# Patient Record
Sex: Male | Born: 1990 | Race: Black or African American | Hispanic: No | Marital: Single | Smoking: Former smoker
Health system: Southern US, Community
[De-identification: ages and names within clinical notes are randomized; demographics above are authoritative.]

---

## 2013-06-21 ENCOUNTER — Encounter (HOSPITAL_COMMUNITY): Payer: Self-pay | Admitting: Emergency Medicine

## 2013-06-21 ENCOUNTER — Emergency Department (HOSPITAL_COMMUNITY)
Admission: EM | Admit: 2013-06-21 | Discharge: 2013-06-21 | Disposition: A | Discharge status: Home / Self Care | Payer: Self-pay | Attending: Emergency Medicine | Admitting: Emergency Medicine

## 2013-06-21 DIAGNOSIS — Y929 Unspecified place or not applicable: Secondary | ICD-10-CM | POA: Insufficient documentation

## 2013-06-21 DIAGNOSIS — S0100XA Unspecified open wound of scalp, initial encounter: Secondary | ICD-10-CM | POA: Insufficient documentation

## 2013-06-21 DIAGNOSIS — Y939 Activity, unspecified: Secondary | ICD-10-CM | POA: Insufficient documentation

## 2013-06-21 DIAGNOSIS — S0101XA Laceration without foreign body of scalp, initial encounter: Secondary | ICD-10-CM

## 2013-06-21 DIAGNOSIS — R296 Repeated falls: Secondary | ICD-10-CM | POA: Insufficient documentation

## 2013-06-21 DIAGNOSIS — Z87891 Personal history of nicotine dependence: Secondary | ICD-10-CM | POA: Insufficient documentation
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
## 2013-06-21 NOTE — ED Notes (Signed)
Pt states he fell on concrete. Pt has 2 inch deep LAC to right side of head.

## 2013-06-21 NOTE — ED Provider Notes (Signed)
Medical screening examination/treatment/procedure(s) were performed by non-physician practitioner and as supervising physician I was immediately available for consultation/collaboration.  EKG Interpretation   None         Brandt Loosen, MD 06/21/13 [phone]

## 2013-06-29 ENCOUNTER — Encounter (HOSPITAL_COMMUNITY): Payer: Self-pay | Admitting: Emergency Medicine

## 2013-06-29 ENCOUNTER — Emergency Department (INDEPENDENT_AMBULATORY_CARE_PROVIDER_SITE_OTHER)
Admission: EM | Admit: 2013-06-29 | Discharge: 2013-06-29 | Disposition: A | Discharge status: Home / Self Care | Payer: Self-pay | Source: Home / Self Care | Attending: Emergency Medicine | Admitting: Emergency Medicine

## 2013-06-29 DIAGNOSIS — Z4802 Encounter for removal of sutures: Secondary | ICD-10-CM

## 2013-06-29 DIAGNOSIS — Z23 Encounter for immunization: Secondary | ICD-10-CM

## 2013-06-29 DIAGNOSIS — S0101XD Laceration without foreign body of scalp, subsequent encounter: Secondary | ICD-10-CM

## 2013-06-29 MED ORDER — TETANUS-DIPHTH-ACELL PERTUSSIS 5-2.5-18.5 LF-MCG/0.5 IM SUSP
0.5000 mL | Freq: Once | INTRAMUSCULAR | Status: AC
  Administered 2013-06-29: 0.5 mL via INTRAMUSCULAR

## 2013-06-29 MED ORDER — TETANUS-DIPHTH-ACELL PERTUSSIS 5-2.5-18.5 LF-MCG/0.5 IM SUSP
INTRAMUSCULAR | Status: AC
  Filled 2013-06-29: qty 0.5

## 2013-06-29 NOTE — ED Provider Notes (Signed)
Chief Complaint:   Chief Complaint  Patient presents with  . Suture / Staple Removal    History of Present Illness:   Kirk Harris is a 22 year old male who sustained a right parietal scalp laceration 8 days ago. This was stapled in the emergency room he returns today for staple removal. He states it hurts just a little, and there's been no drainage.    Review of Systems:  Other than noted above, the patient denies any of the following symptoms: Systemic:  No fever or chills. Eye:  No eye pain, redness, diplopia or blurred vision ENT:  No bleeding from nose or ears.  No loose or broken teeth. Neck:  No pain or limited ROM. GI:  No nausea or vomiting. Neuro:  No loss of consciousness, seizure activity, numbness, tingling, or weakness.  PMFSH:  Past medical history, family history, social history, meds, and allergies were reviewed.    Physical Exam:   Vital signs:  BP 117/67  Pulse 64  Temp(Src) 98.3 F (36.8 C) (Oral)  Resp 14  SpO2 98% General:  Alert and oriented times 3.  In no distress. Eye:  PERRL, full EOMs.  Lids and conjunctivas normal. HEENT:  There is a stapled laceration in the right parietal area. There is some crusted blood, but no evidence of infection. No swelling or tenderness to palpation.  Procedure: Verbal informed consent was obtained.  The patient was informed of the risks and benefits of the procedure and understands and accepts.  Identity of the patient was verified verbally and by wristband. The area of laceration was prepped with alcohol and the staples were removed. The patient tolerated this well and he was given wound care instructions.  Assessment:  The encounter diagnosis was Scalp laceration, subsequent encounter.  The laceration appears to be well-healed with no evidence of infection.  Plan:   1.  Meds:  The following meds were prescribed:  There are no discharge medications for this patient.   2.  Patient Education/Counseling:  The patient was  given appropriate handouts, self care instructions, and instructed in symptomatic relief. Instructions were given for wound care.  Other than washing with soap and water there is no special wound care for him at this time.  3.  Follow up:  The patient was told to follow up immediately if there is any sign of infection.   Reuben Likes, MD 06/29/13 [phone]

## 2013-06-29 NOTE — ED Notes (Signed)
Pt  Reports       He  Is  Here  For  Staple  Removal        They  Appear  To  Be  Well  Healing

## 2013-10-25 ENCOUNTER — Emergency Department (HOSPITAL_COMMUNITY)
Admission: EM | Admit: 2013-10-25 | Discharge: 2013-10-26 | Disposition: A | Discharge status: Home / Self Care | Payer: No Typology Code available for payment source | Attending: Emergency Medicine | Admitting: Emergency Medicine

## 2013-10-25 ENCOUNTER — Encounter (HOSPITAL_COMMUNITY): Payer: Self-pay | Admitting: Emergency Medicine

## 2013-10-25 DIAGNOSIS — S199XXA Unspecified injury of neck, initial encounter: Principal | ICD-10-CM

## 2013-10-25 DIAGNOSIS — M542 Cervicalgia: Secondary | ICD-10-CM

## 2013-10-25 DIAGNOSIS — Z87891 Personal history of nicotine dependence: Secondary | ICD-10-CM | POA: Insufficient documentation

## 2013-10-25 DIAGNOSIS — M549 Dorsalgia, unspecified: Secondary | ICD-10-CM

## 2013-10-25 DIAGNOSIS — IMO0002 Reserved for concepts with insufficient information to code with codable children: Secondary | ICD-10-CM | POA: Insufficient documentation

## 2013-10-25 DIAGNOSIS — Z791 Long term (current) use of non-steroidal anti-inflammatories (NSAID): Secondary | ICD-10-CM | POA: Insufficient documentation

## 2013-10-25 DIAGNOSIS — Y92410 Unspecified street and highway as the place of occurrence of the external cause: Secondary | ICD-10-CM | POA: Insufficient documentation

## 2013-10-25 DIAGNOSIS — Y9389 Activity, other specified: Secondary | ICD-10-CM | POA: Insufficient documentation

## 2013-10-25 DIAGNOSIS — S0993XA Unspecified injury of face, initial encounter: Secondary | ICD-10-CM | POA: Insufficient documentation
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
## 2013-10-26 MED ORDER — NAPROXEN 500 MG PO TABS: 500.0000 mg | ORAL_TABLET | Freq: Two times a day (BID) | ORAL | Status: DC

## 2013-10-26 MED ORDER — METHOCARBAMOL 500 MG PO TABS: 500.0000 mg | ORAL_TABLET | Freq: Two times a day (BID) | ORAL | Status: DC | PRN

## 2013-10-26 MED ORDER — KETOROLAC TROMETHAMINE 60 MG/2ML IM SOLN
60.0000 mg | Freq: Once | INTRAMUSCULAR | Status: AC
  Administered 2013-10-26: 60 mg via INTRAMUSCULAR
  Filled 2013-10-26 (×2): qty 2
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
## 2013-10-26 NOTE — Discharge Instructions (Signed)
Back Pain, Adult Low back pain is very common. About 1 in 5 people have back pain.The cause of low back pain is rarely dangerous. The pain often gets better over time.About half of people with a sudden onset of back pain feel better in just 2 weeks. About 8 in 10 people feel better by 6 weeks.  CAUSES Some common causes of back pain include:  Strain of the muscles or ligaments supporting the spine.  Wear and tear (degeneration) of the spinal discs.  Arthritis.  Direct injury to the back. DIAGNOSIS Most of the time, the direct cause of low back pain is not known.However, back pain can be treated effectively even when the exact cause of the pain is unknown.Answering your caregiver's questions about your overall health and symptoms is one of the most accurate ways to make sure the cause of your pain is not dangerous. If your caregiver needs more information, he or she may order lab work or imaging tests (X-rays or MRIs).However, even if imaging tests show changes in your back, this usually does not require surgery. HOME CARE INSTRUCTIONS For many people, back pain returns.Since low back pain is rarely dangerous, it is often a condition that people can learn to manageon their own.   Remain active. It is stressful on the back to sit or stand in one place. Do not sit, drive, or stand in one place for more than 30 minutes at a time. Take short walks on level surfaces as soon as pain allows.Try to increase the length of time you walk each day.  Do not stay in bed.Resting more than 1 or 2 days can delay your recovery.  Do not avoid exercise or work.Your body is made to move.It is not dangerous to be active, even though your back may hurt.Your back will likely heal faster if you return to being active before your pain is gone.  Pay attention to your body when you bend and lift. Many people have less discomfortwhen lifting if they bend their knees, keep the load close to their bodies,and  avoid twisting. Often, the most comfortable positions are those that put less stress on your recovering back.  Find a comfortable position to sleep. Use a firm mattress and lie on your side with your knees slightly bent. If you lie on your back, put a pillow under your knees.  Only take over-the-counter or prescription medicines as directed by your caregiver. Over-the-counter medicines to reduce pain and inflammation are often the most helpful.Your caregiver may prescribe muscle relaxant drugs.These medicines help dull your pain so you can more quickly return to your normal activities and healthy exercise.  Put ice on the injured area.  Put ice in a plastic bag.  Place a towel between your skin and the bag.  Leave the ice on for 15-20 minutes, 03-04 times a day for the first 2 to 3 days. After that, ice and heat may be alternated to reduce pain and spasms.  Ask your caregiver about trying back exercises and gentle massage. This may be of some benefit.  Avoid feeling anxious or stressed.Stress increases muscle tension and can worsen back pain.It is important to recognize when you are anxious or stressed and learn ways to manage it.Exercise is a great option. SEEK MEDICAL CARE IF:  You have pain that is not relieved with rest or medicine.  You have pain that does not improve in 1 week.  You have new symptoms.  You are generally not feeling well. SEEK   IMMEDIATE MEDICAL CARE IF:   You have pain that radiates from your back into your legs.  You develop new bowel or bladder control problems.  You have unusual weakness or numbness in your arms or legs.  You develop nausea or vomiting.  You develop abdominal pain.  You feel faint. Document Released: 08/06/2005 Document Revised: 02/05/2012 Document Reviewed: 12/25/2010 Exit[employer] Patient Information 2014 [employer].  

## 2013-10-26 NOTE — ED Provider Notes (Signed)
Medical screening examination/treatment/procedure(s) were performed by non-physician practitioner and as supervising physician I was immediately available for consultation/collaboration.   EKG Interpretation None       Sunnie NielsenBrian Nazli Penn, MD 10/26/13 [phone]

## 2014-06-28 ENCOUNTER — Encounter (HOSPITAL_COMMUNITY): Payer: Self-pay | Admitting: *Deleted

## 2014-06-28 ENCOUNTER — Emergency Department (HOSPITAL_COMMUNITY)
Admission: EM | Admit: 2014-06-28 | Discharge: 2014-06-28 | Disposition: A | Discharge status: Home / Self Care | Payer: Worker's Compensation | Attending: Emergency Medicine | Admitting: Emergency Medicine

## 2014-06-28 DIAGNOSIS — Z791 Long term (current) use of non-steroidal anti-inflammatories (NSAID): Secondary | ICD-10-CM | POA: Diagnosis not present

## 2014-06-28 DIAGNOSIS — Y9289 Other specified places as the place of occurrence of the external cause: Secondary | ICD-10-CM | POA: Insufficient documentation

## 2014-06-28 DIAGNOSIS — Z87891 Personal history of nicotine dependence: Secondary | ICD-10-CM | POA: Insufficient documentation

## 2014-06-28 DIAGNOSIS — S61412A Laceration without foreign body of left hand, initial encounter: Secondary | ICD-10-CM | POA: Diagnosis present

## 2014-06-28 DIAGNOSIS — S61022A Laceration with foreign body of left thumb without damage to nail, initial encounter: Secondary | ICD-10-CM | POA: Diagnosis not present

## 2014-06-28 DIAGNOSIS — Y990 Civilian activity done for income or pay: Secondary | ICD-10-CM | POA: Diagnosis not present

## 2014-06-28 DIAGNOSIS — S61012A Laceration without foreign body of left thumb without damage to nail, initial encounter: Secondary | ICD-10-CM

## 2014-06-28 DIAGNOSIS — Y9389 Activity, other specified: Secondary | ICD-10-CM | POA: Insufficient documentation

## 2014-06-28 DIAGNOSIS — W275XXA Contact with paper-cutter, initial encounter: Secondary | ICD-10-CM | POA: Diagnosis not present

## 2014-06-28 NOTE — ED Provider Notes (Signed)
CSN: [id number]     Arrival date & time 06/28/14  1832 History  This chart was scribed for non-physician practitioner, Harle BattiestElizabeth Raynisha Avilla, NP working with Gwyneth SproutWhitney Plunkett, MD by Gwenyth Oberatherine Macek, ED scribe. This patient was seen in room TR06C/TR06C and the patient's care was started at 6:56 PM  Chief Complaint  Patient presents with  . Extremity Laceration   The history is provided by the patient. No language interpreter was used.    HPI Comments: Kirk Harris is a 23 y.o. male who presents to the Emergency Department complaining of a laceration with controlled bleeding to left hand after he cut it with a box cutter at work. Pt denies any pain associated with the wound. He notes he is up to date with tetanus vaccinations. 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   History reviewed. No pertinent past medical history. History reviewed. No pertinent past surgical history. No family history on file. History  Substance Use Topics  . Smoking status: Former Games developermoker  . Smokeless tobacco: Never Used  . Alcohol Use: Yes     Comment: occ    Review of Systems  Musculoskeletal: Negative for joint swelling and arthralgias.  Skin: Positive for wound. Negative for color change.    Allergies  Review of patient's allergies indicates no known allergies.  Home Medications   Prior to Admission medications   Medication Sig Start Date End Date Taking? Authorizing Provider  methocarbamol (ROBAXIN) 500 MG tablet Take 1 tablet (500 mg total) by mouth 2 (two) times daily as needed for muscle spasms. 10/26/13   Rudene AndaJacob Gray Lackey, PA-C  naproxen (NAPROSYN) 500 MG tablet Take 1 tablet (500 mg total) by mouth 2 (two) times daily. 10/26/13   Rudene AndaJacob Gray Lackey, PA-C   BP 114/64 mmHg  Pulse 58  Temp(Src) 98.3 F (36.8 C) (Oral)  Resp 20  Ht 6' (1.829 m)  Wt 198 lb 7 oz (90.011 kg)  BMI 26.91 kg/m2  SpO2 97% Physical Exam  Constitutional: He is oriented to person, place, and time. He appears  well-developed and well-nourished. No distress.  HENT:  Head: Normocephalic and atraumatic.  Mouth/Throat: Oropharynx is clear and moist. No oropharyngeal exudate.  Eyes: Pupils are equal, round, and reactive to light.  Neck: Neck supple.  Cardiovascular: Normal rate.   Pulmonary/Chest: Effort normal.  Musculoskeletal: He exhibits no edema.  2 cm laceration; edges well approximated going parallel with thumb, medial dorsal aspect of left thumb just distal to the MCP. 5/5 strength with extension, flexion, abduction and adduction.    Neurological: He is alert and oriented to person, place, and time. No cranial nerve deficit.  Skin: Skin is warm and dry. No rash noted.  Psychiatric: He has a normal mood and affect. His behavior is normal.  Nursing note and vitals reviewed.   ED Course  Procedures (including critical care time)  LACERATION REPAIR Performed by: Harle Battiestysinger, Merve Hotard Authorized by: Harle Battiestysinger, Jari Dipasquale Consent: Verbal consent obtained. Risks and benefits: risks, benefits and alternatives were discussed Consent given by: patient Patient identity confirmed: provided demographic data Prepped and Draped in normal sterile fashion Wound explored  Laceration Location: left thumb  Laceration Length: 2cm  No Foreign Bodies seen or palpated  Anesthesia: none  Local anesthetic: none  Anesthetic total: N/A  Irrigation method: syringe Amount of cleaning: standard  Skin closure: Dermabond  Number of sutures: N/A  Technique: N/A  Patient tolerance: Patient tolerated the procedure well with no immediate complications.  DIAGNOSTIC STUDIES: Oxygen Saturation is 97%  on RA, normal by my interpretation.    COORDINATION OF CARE: 7:00 PM Discussed treatment plan with pt at bedside and pt agreed to plan.  Labs Review Labs Reviewed - No data to display  Imaging Review No results found.   EKG Interpretation None      MDM   Final diagnoses:  Thumb laceration,  left, initial encounter   23 yo male with laceration amenable to dermabond repair. His Tdap is UTD. Pressure irrigation performed. Laceration occurred < 2 hours prior to repair which was well tolerated. Pt has no co morbidities to effect normal wound healing. Discussed adhesive home care w pt and answered questions. Pt to f-u for wound check in 7 days. Pt is hemodynamically stable w no complaints prior to dc.  Pt aware of plan and in agreement.  Return precautions provided.   I personally performed the services described in this documentation, which was scribed in my presence. The recorded information has been reviewed and is accurate.  Filed Vitals:   06/28/14 1834 06/28/14 1837  BP:  114/64  Pulse:  58  Temp:  98.3 F (36.8 C)  TempSrc:  Oral  Resp:  20  Height:  6' (1.829 m)  Weight: 198 lb 7 oz (90.011 kg)   SpO2:  97%   Meds given in ED:  Medications - No data to display  Discharge Medication List as of 06/28/2014  7:05 PM         Harle BattiestElizabeth Britt Theard, NP 06/28/14 [phone]  Gwyneth SproutWhitney Plunkett, MD 06/29/14 1511

## 2014-06-28 NOTE — Discharge Instructions (Signed)
Please follow directions provided. [facility]. The adhesive material stay in place for several days but do not pick at it or pull it off. Keep your wound clean and dry and wear dressing as needed. Don't hesitate to return for any new, worsening, or concerning symptoms.  SEEK IMMEDIATE MEDICAL CARE IF:  Your pain is not controlled with prescribed medicine.  You have severe swelling around the wound causing pain and numbness or a change in color in your arm, hand, leg, or foot.  Your wound splits open and starts bleeding.  You have worsening numbness, weakness, or loss of function of any joint around or beyond the wound.  You develop painful lumps near the wound or on the skin anywhere on your body.

## 2014-06-28 NOTE — ED Notes (Signed)
Pt was at work using R hand to cut box in L hand and sliced through cardboard, cutting L thumb, lateral aspect.  Bleeding controlled.

## 2014-07-02 ENCOUNTER — Emergency Department (HOSPITAL_COMMUNITY)
Admission: EM | Admit: 2014-07-02 | Discharge: 2014-07-02 | Disposition: A | Discharge status: Home / Self Care | Payer: Worker's Compensation | Attending: Emergency Medicine | Admitting: Emergency Medicine

## 2014-07-02 ENCOUNTER — Encounter (HOSPITAL_COMMUNITY): Payer: Self-pay | Admitting: *Deleted

## 2014-07-02 DIAGNOSIS — Y838 Other surgical procedures as the cause of abnormal reaction of the patient, or of later complication, without mention of misadventure at the time of the procedure: Secondary | ICD-10-CM | POA: Diagnosis not present

## 2014-07-02 DIAGNOSIS — Z87891 Personal history of nicotine dependence: Secondary | ICD-10-CM | POA: Insufficient documentation

## 2014-07-02 DIAGNOSIS — T8133XA Disruption of traumatic injury wound repair, initial encounter: Secondary | ICD-10-CM

## 2014-07-02 DIAGNOSIS — Z791 Long term (current) use of non-steroidal anti-inflammatories (NSAID): Secondary | ICD-10-CM | POA: Diagnosis not present

## 2014-07-02 DIAGNOSIS — T8131XA Disruption of external operation (surgical) wound, not elsewhere classified, initial encounter: Secondary | ICD-10-CM | POA: Insufficient documentation

## 2014-07-02 DIAGNOSIS — Z4801 Encounter for change or removal of surgical wound dressing: Secondary | ICD-10-CM | POA: Diagnosis present

## 2014-07-02 NOTE — Discharge Instructions (Signed)
Read the information below.  You may return to the Emergency Department at any time for worsening condition or any new symptoms that concern you.  If you develop redness, swelling, pus draining from the wound, or fevers greater than 100.4, return to the ER immediately for a recheck.    Wound Dehiscence Wound dehiscence is when a surgical cut (incision) breaks open and does not heal properly after surgery. It usually happens 7-10 days after surgery. This can be a serious condition. It is important to identify and treat this condition early.  CAUSES  Some common causes of wound dehiscence include:  Stretching of the wound area. This may be caused by lifting, vomiting, violent coughing, or straining during bowel movements.  Wound infection.  Early stitch (suture) removal. RISK FACTORS Various things can increase your risk of developing wound dehiscence, including:  Obesity.  Lung disease.  Smoking.  Poor nutrition.  Contamination during surgery. SIGNS AND SYMPTOMS  Bleeding from the wound.  Pain.  Fever.  Wound starts breaking open. DIAGNOSIS  Your health care provider may diagnose wound dehiscence by monitoring the incision and noting any changes in the wound. These changes can include an increase in drainage or pain. The health care provider may also ask you if you have noticed any stretching or tearing of the wound.  Wound cultures may be taken to determine if there is an infection.  Imaging studies, such as an MRI scan or CT scan, may be done to determine if there is a collection of pus or fluid in the wound area. TREATMENT Treatment may include:  Wound care.  Surgical repair.  Antibiotic medicine to treat or prevent infection.  Medicines to reduce pain and swelling. HOME CARE INSTRUCTIONS   Only take over-the-counter or prescription medicines for pain, discomfort, or fever as directed by your health care provider. Taking pain medicine 30 minutes before changing  a bandage (dressing) can help relieve pain.  Take your antibiotics as directed. Finish them even if you start to feel better.  Gently wash the area with mild soap and water 2 times a day, or as directed. Rinse off the soap. Pat the area dry with a clean towel. Do not rub the wound. This may cause bleeding.  Follow your health care provider's instructions for how often you need to change the dressing and packing inside. Wash your hands well before and after changing your dressing. Apply a dressing to the wound as directed.  Take showers. Do not soak the wound, bathe, swim, or use a hot tub until directed by your health care provider.  Avoid exercises that make you sweat heavily.  Use anti-itch medicine as directed by your health care provider. The wound may itch when it is healing. Do not pick or scratch at the wound.  Do not lift more than 10 pounds (4.5 kg) until the wound is healed, or as directed by your health care provider.  Keep all follow-up appointments as directed. SEEK MEDICAL CARE IF:  You have excessive bleeding from your surgical wound.  Your wound does not seem to be healing properly.  You have a fever. SEEK IMMEDIATE MEDICAL CARE IF:   You have increased swelling or redness around the wound.  You have increasing pain in the wound.  You have an increasing amount of pus coming from the wound.  Your wound breaks open farther. MAKE SURE YOU:   Understand these instructions.  Will watch your condition.  Will get help right away if you are  not doing well or get worse. Document Released: 10/27/2003 Document Revised: 08/11/2013 Document Reviewed: 04/13/2013 [facility] Patient Information 2015 [place], [place]. This information is not intended to replace advice given to you by your health care provider. Make sure you discuss any questions you have with your health care provider.   Laceration Care, Adult A laceration is a cut or lesion that goes through all layers of  the skin and into the tissue just beneath the skin. TREATMENT  Some lacerations may not require closure. Some lacerations may not be able to be closed due to an increased risk of infection. It is important to see your caregiver as soon as possible after an injury to minimize the risk of infection and maximize the opportunity for successful closure. If closure is appropriate, pain medicines may be given, if needed. The wound will be cleaned to help prevent infection. Your caregiver will use stitches (sutures), staples, wound glue (adhesive), or skin adhesive strips to repair the laceration. These tools bring the skin edges together to allow for faster healing and a better cosmetic outcome. However, all wounds will heal with a scar. Once the wound has healed, scarring can be minimized by covering the wound with sunscreen during the day for 1 full year. HOME CARE INSTRUCTIONS  For sutures or staples:  Keep the wound clean and dry.  If you were given a bandage (dressing), you should change it at least once a day. Also, change the dressing if it becomes wet or dirty, or as directed by your caregiver.  Wash the wound with soap and water 2 times a day. Rinse the wound off with water to remove all soap. Pat the wound dry with a clean towel.  After cleaning, apply a thin layer of the antibiotic ointment as recommended by your caregiver. This will help prevent infection and keep the dressing from sticking.  You may shower as usual after the first 24 hours. Do not soak the wound in water until the sutures are removed.  Only take over-the-counter or prescription medicines for pain, discomfort, or fever as directed by your caregiver.  Get your sutures or staples removed as directed by your caregiver. For skin adhesive strips:  Keep the wound clean and dry.  Do not get the skin adhesive strips wet. You may bathe carefully, using caution to keep the wound dry.  If the wound gets wet, pat it dry with a  clean towel.  Skin adhesive strips will fall off on their own. You may trim the strips as the wound heals. Do not remove skin adhesive strips that are still stuck to the wound. They will fall off in time. For wound adhesive:  You may briefly wet your wound in the shower or bath. Do not soak or scrub the wound. Do not swim. Avoid periods of heavy perspiration until the skin adhesive has fallen off on its own. After showering or bathing, gently pat the wound dry with a clean towel.  Do not apply liquid medicine, cream medicine, or ointment medicine to your wound while the skin adhesive is in place. This may loosen the film before your wound is healed.  If a dressing is placed over the wound, be careful not to apply tape directly over the skin adhesive. This may cause the adhesive to be pulled off before the wound is healed.  Avoid prolonged exposure to sunlight or tanning lamps while the skin adhesive is in place. Exposure to ultraviolet light in the first year will darken the  scar.  The skin adhesive will usually remain in place for 5 to 10 days, then naturally fall off the skin. Do not pick at the adhesive film. You may need a tetanus shot if:  You cannot remember when you had your last tetanus shot.  You have never had a tetanus shot. If you get a tetanus shot, your arm may swell, get red, and feel warm to the touch. This is common and not a problem. If you need a tetanus shot and you choose not to have one, there is a rare chance of getting tetanus. Sickness from tetanus can be serious. SEEK MEDICAL CARE IF:   You have redness, swelling, or increasing pain in the wound.  You see a red line that goes away from the wound.  You have yellowish-white fluid (pus) coming from the wound.  You have a fever.  You notice a bad smell coming from the wound or dressing.  Your wound breaks open before or after sutures have been removed.  You notice something coming out of the wound such as wood  or glass.  Your wound is on your hand or foot and you cannot move a finger or toe. SEEK IMMEDIATE MEDICAL CARE IF:   Your pain is not controlled with prescribed medicine.  You have severe swelling around the wound causing pain and numbness or a change in color in your arm, hand, leg, or foot.  Your wound splits open and starts bleeding.  You have worsening numbness, weakness, or loss of function of any joint around or beyond the wound.  You develop painful lumps near the wound or on the skin anywhere on your body. MAKE SURE YOU:   Understand these instructions.  Will watch your condition.  Will get help right away if you are not doing well or get worse. Document Released: 08/06/2005 Document Revised: 10/29/2011 Document Reviewed: 01/30/2011 [facility] Patient Information 2015 [place], [place]. This information is not intended to replace advice given to you by your health care provider. Make sure you discuss any questions you have with your health care provider.

## 2014-07-02 NOTE — ED Provider Notes (Signed)
CSN: [id number]     Arrival date & time 07/02/14  1204 History  This chart was scribed for non-physician practitioner, Trixie DredgeEmily Nealy Hickmon, PA-C,working with Ward GivensIva L Knapp, MD, by Karle PlumberJennifer Tensley, ED Scribe. This patient was seen in room TR11C/TR11C and the patient's care was started at 12:40 PM.  Chief Complaint  Patient presents with  . Wound Check   Patient is a 23 y.o. male presenting with wound check. The history is provided by the patient. No language interpreter was used.  Wound Check    HPI Comments:  Kirk Harris is a 23 y.o. male who presents to the Emergency Department complaining of a laceration to his left thumb that occurred four days ago. He states he was seen here after accidentally cutting himself with a box cutter at work and DermaBond was applied. He states the DermaBond has now fallen off and the wound has opened up. He reports only mild soreness of the area. Pt reports keeping the area covered in the same dressing that was applied when he was here. He denies numbness, tingling or weakness of the thumb, fever, chills, warmth, drainage or red streaking to the area.   History reviewed. No pertinent past medical history. History reviewed. No pertinent past surgical history. History reviewed. No pertinent family history. History  Substance Use Topics  . Smoking status: Former Games developermoker  . Smokeless tobacco: Never Used  . Alcohol Use: Yes     Comment: occ    Review of Systems  Constitutional: Negative for fever and chills.  Skin: Positive for wound. Negative for color change.  Neurological: Negative for weakness and numbness.  All other systems reviewed and are negative.   Allergies  Review of patient's allergies indicates no known allergies.  Home Medications   Prior to Admission medications   Medication Sig Start Date End Date Taking? Authorizing Provider  methocarbamol (ROBAXIN) 500 MG tablet Take 1 tablet (500 mg total) by mouth 2 (two) times daily as needed for muscle  spasms. 10/26/13   Rudene AndaJacob Gray Lackey, PA-C  naproxen (NAPROSYN) 500 MG tablet Take 1 tablet (500 mg total) by mouth 2 (two) times daily. 10/26/13   Rudene AndaJacob Gray Lackey, PA-C   Triage Vitals: BP 115/63 mmHg  Pulse 75  Temp(Src) 97.7 F (36.5 C) (Oral)  Resp 16  Ht 6' (1.829 m)  Wt 199 lb (90.266 kg)  BMI 26.98 kg/m2  SpO2 97% Physical Exam  Constitutional: He appears well-developed and well-nourished. No distress.  HENT:  Head: Normocephalic and atraumatic.  Neck: Neck supple.  Pulmonary/Chest: Effort normal.  Neurological: He is alert.  Skin: He is not diaphoretic.  Left dorsal thumb over first MTP with linear laceration with dehiscence. Dried DermaBond loosely adhered to one side of the wound.  No erythema, edema, warmth, discharge, or tenderness   Nursing note and vitals reviewed.   ED Course  Procedures (including critical care time) DIAGNOSTIC STUDIES: Oxygen Saturation is 97% on RA, normal by my interpretation.   COORDINATION OF CARE: 12:44 PM- Will redress wound. Pt verbalizes understanding and agrees to plan.  Medications - No data to display  Labs Review Labs Reviewed - No data to display  Imaging Review No results found.   EKG Interpretation None      MDM   Final diagnoses:  Dehiscence of laceration repair, initial encounter    Afebrile, nontoxic patient with dehiscence of laceration repaired 4 days ago.  Wound was initially dermabonded.   Unable to close given amount of time it may  have been open.  No e/o infection.  Discussed wound care and strict return precautions.  Wound appears very healthy.  D/C home with wound care, PCP follow up.  Discussed result, findings, treatment, and follow up  with patient.  Pt given return precautions.  Pt verbalizes understanding and agrees with plan.       I personally performed the services described in this documentation, which was scribed in my presence. The recorded information has been reviewed and is  accurate.    Trixie Dredgemily Athanasios Heldman, PA-C 07/02/14 1401  Ward GivensIva L Knapp, MD 07/02/14 [phone]

## 2014-07-02 NOTE — ED Notes (Addendum)
Pt reports being seen on Monday and had dermabond placed to left thumb laceration. Reports not looking at it until today and then noticed it had opened back up and dried blood noted around site. No redness or swelling noted. No other complaints.

## 2017-08-18 ENCOUNTER — Ambulatory Visit (HOSPITAL_COMMUNITY)
Admission: EM | Admit: 2017-08-18 | Discharge: 2017-08-18 | Disposition: A | Discharge status: Home / Self Care | Payer: BLUE CROSS/BLUE SHIELD | Attending: Internal Medicine | Admitting: Internal Medicine

## 2017-08-18 ENCOUNTER — Other Ambulatory Visit: Payer: Self-pay

## 2017-08-18 ENCOUNTER — Encounter (HOSPITAL_COMMUNITY): Payer: Self-pay | Admitting: Emergency Medicine

## 2017-08-18 DIAGNOSIS — H1032 Unspecified acute conjunctivitis, left eye: Secondary | ICD-10-CM

## 2017-08-18 MED ORDER — SULFACETAMIDE SODIUM 10 % OP SOLN: 1.0000 [drp] | Freq: Four times a day (QID) | OPHTHALMIC | 0 refills | Status: AC

## 2017-08-18 MED ORDER — POLYETHYL GLYCOL-PROPYL GLYCOL 0.4-0.3 % OP GEL: 1.0000 "application " | Freq: Every evening | OPHTHALMIC | 0 refills | Status: DC | PRN

## 2017-08-18 NOTE — ED Provider Notes (Signed)
[facility]    CSN: [id number] Arrival date & time: 08/18/17  1809     History   Chief Complaint Chief Complaint  Patient presents with  . Eye Problem    HPI Kirk Harris is a 26 y.o. male.   26 year old male comes in for a few hour history of left eye redness and crusting.  States he woke up a few hours ago with eye crusting shut.  He has  left eye redness without pain, irritation, vision changes.  Denies photophobia.  No known injury/trauma.  Denies contact lens use, glasses use.  States he went to the pharmacy for over-the-counter medication, he was told by pharmacist to come in for evaluation.  Had URI symptoms a few weeks ago, but has since resolved.      History reviewed. No pertinent past medical history.  There are no active problems to display for this patient.   History reviewed. No pertinent surgical history.     Home Medications    Prior to Admission medications   Medication Sig Start Date End Date Taking? Authorizing Provider  methocarbamol (ROBAXIN) 500 MG tablet Take 1 tablet (500 mg total) by mouth 2 (two) times daily as needed for muscle spasms. 10/26/13   Cristobal GoldmannLackey, Jacob, PA-C  naproxen (NAPROSYN) 500 MG tablet Take 1 tablet (500 mg total) by mouth 2 (two) times daily. 10/26/13   Cristobal GoldmannLackey, Jacob, PA-C  Polyethyl Glycol-Propyl Glycol (SYSTANE) 0.4-0.3 % GEL ophthalmic gel Place 1 application into both eyes at bedtime as needed. 08/18/17   Cathie HoopsYu, Antanisha Mohs V, PA-C  sulfacetamide (BLEPH-10) 10 % ophthalmic solution Place 1-2 drops into the left eye 4 (four) times daily for 7 days. 08/18/17 08/25/17  Belinda FisherYu, Lovett Coffin V, PA-C    Family History Family History  Problem Relation Age of Onset  . Healthy Mother     Social History Social History   Tobacco Use  . Smoking status: Former Games developermoker  . Smokeless tobacco: Never Used  Substance Use Topics  . Alcohol use: Yes    Comment: occ  . Drug use: No     Allergies   Patient has no known allergies.   Review of  Systems Review of Systems  Reason unable to perform ROS: See HPI as above.     Physical Exam Triage Vital Signs ED Triage Vitals [08/18/17 1951]  Enc Vitals Group     BP 105/72     Pulse Rate 60     Resp 18     Temp 97.9 F (36.6 C)     Temp Source Oral     SpO2 99 %     Weight      Height      Head Circumference      Peak Flow      Pain Score      Pain Loc      Pain Edu?      Excl. in GC?    No data found.  Updated Vital Signs BP 105/72 (BP Location: Right Arm)   Pulse 60   Temp 97.9 F (36.6 C) (Oral)   Resp 18   SpO2 99%   Visual Acuity Right Eye Distance:   20/25 ([place]) Left Eye Distance:   20/20 ([place]) Bilateral Distance:    Right Eye Near:   Left Eye Near:    Bilateral Near:     Physical Exam  Constitutional: He is oriented to person, place, and time. He appears well-developed and well-nourished. No distress.  HENT:  Head: Normocephalic and atraumatic.  Eyes: EOM and lids are normal. Pupils are equal, round, and reactive to light. Lids are everted and swept, no foreign bodies found. No foreign body present in the right eye. No foreign body present in the left eye. Right conjunctiva is not injected. Left conjunctiva is injected.  Left eye conjunctival injection without ciliary injection.  Neck: Normal range of motion. Neck supple.  Neurological: He is alert and oriented to person, place, and time.  Skin: Skin is warm and dry.     UC Treatments / Results  Labs (all labs ordered are listed, but only abnormal results are displayed) Labs Reviewed - No data to display  EKG  EKG Interpretation None       Radiology No results found.  Procedures Procedures (including critical care time)  Medications Ordered in UC Medications - No data to display   Initial Impression / Assessment and Plan / UC Course  I have reviewed the triage vital signs and the nursing notes.  Pertinent labs & imaging results that were available during my care of the  patient were reviewed by me and considered in my medical decision making (see chart for details).    Start sulfacetamide drops as directed. Artificial tears gel as directed. Lid scrubs and warm compresses as directed. Patient to follow up with ophthalmology if symptoms worsens or does not improve. Return precautions given.    Final Clinical Impressions(s) / UC Diagnoses   Final diagnoses:  Acute bacterial conjunctivitis of left eye    ED Discharge Orders        Ordered    sulfacetamide (BLEPH-10) 10 % ophthalmic solution  4 times daily     08/18/17 2021    Polyethyl Glycol-Propyl Glycol (SYSTANE) 0.4-0.3 % GEL ophthalmic gel  At bedtime PRN     08/18/17 2021        Belinda FisherYu, Shemekia Patane V, PA-C 08/18/17 2026

## 2017-08-18 NOTE — ED Triage Notes (Signed)
Woke up a few hours ago and left eye red and had crusty drainage around lashes.  No known injury.Marland Kitchen.does not wear contacts

## 2017-08-18 NOTE — Discharge Instructions (Signed)
Use sulfacetamide eyedrops as directed on left eye. Artificial tear gel at night. Wait 10-15 minutes between drops, always use artificial tear gel last, as it prevents drops from penetrating through. Lid scrubs and warm compresses as directed. Monitor for any worsening of symptoms, changes in vision, sensitivity to light, eye swelling, follow up with ophthalmology for further evaluation.

## 2018-09-11 ENCOUNTER — Ambulatory Visit: Payer: Managed Care, Other (non HMO)

## 2018-09-11 ENCOUNTER — Ambulatory Visit
Admission: EM | Admit: 2018-09-11 | Discharge: 2018-09-11 | Disposition: A | Discharge status: Home / Self Care | Payer: Managed Care, Other (non HMO) | Attending: Family Medicine | Admitting: Family Medicine

## 2018-09-11 ENCOUNTER — Encounter: Payer: Self-pay | Admitting: Emergency Medicine

## 2018-09-11 DIAGNOSIS — J209 Acute bronchitis, unspecified: Secondary | ICD-10-CM | POA: Insufficient documentation

## 2018-09-11 DIAGNOSIS — Z87891 Personal history of nicotine dependence: Secondary | ICD-10-CM | POA: Diagnosis not present

## 2018-09-11 DIAGNOSIS — M940 Chondrocostal junction syndrome [Tietze]: Secondary | ICD-10-CM

## 2018-09-11 MED ORDER — IPRATROPIUM-ALBUTEROL 0.5-2.5 (3) MG/3ML IN SOLN: 3.0000 mL | Freq: Once | RESPIRATORY_TRACT | Status: DC

## 2018-09-11 MED ORDER — PREDNISONE 20 MG PO TABS: 20.0000 mg | ORAL_TABLET | Freq: Two times a day (BID) | ORAL | 0 refills | Status: AC

## 2018-09-11 NOTE — ED Provider Notes (Addendum)
[facility]   [id number] 09/11/18 Arrival Time: 1017   CC: URI symptoms   SUBJECTIVE: History from: patient.  Kirk Harris is a 28 y.o. male who presents with productive cough with green/ yellow sputum x 3 days.  States he was sick a few weeks ago, symptoms improved, but cough has reoccurred.  Denies positive sick exposure or precipitating event.  Has tried OTC nyquil without relief.  Symptoms are made worse with activity.  Denies previous symptoms in the past.  Complains of associated fatigue, rib pain, and chest wall pain with cough and deep inspiration. Denies fever, chills, sinus pain, rhinorrhea, sore throat, SOB, wheezing, chest pain, nausea, changes in bowel or bladder habits.    Denies recent long travel, lower extremity surgery, hx of malignancy, hx of blood clots, SOB, hormone or steroid use.  Does admit to intermittent tobacco use.    Received flu shot this year: no.  ROS: As per HPI.  History reviewed. No pertinent past medical history. History reviewed. No pertinent surgical history. No Known Allergies No current facility-administered medications on file prior to encounter.    No current outpatient medications on file prior to encounter.   Social History   Socioeconomic History  . Marital status: Single    Spouse name: Not on file  . Number of children: Not on file  . Years of education: Not on file  . Highest education level: Not on file  Occupational History  . Not on file  Social Needs  . Financial resource strain: Not on file  . Food insecurity:    Worry: Not on file    Inability: Not on file  . Transportation needs:    Medical: Not on file    Non-medical: Not on file  Tobacco Use  . Smoking status: Former Games developer  . Smokeless tobacco: Never Used  Substance and Sexual Activity  . Alcohol use: Yes    Comment: occ  . Drug use: No  . Sexual activity: Yes  Lifestyle  . Physical activity:    Days per week: Not on file    Minutes per  session: Not on file  . Stress: Not on file  Relationships  . Social connections:    Talks on phone: Not on file    Gets together: Not on file    Attends religious service: Not on file    Active member of club or organization: Not on file    Attends meetings of clubs or organizations: Not on file    Relationship status: Not on file  . Intimate partner violence:    Fear of current or ex partner: Not on file    Emotionally abused: Not on file    Physically abused: Not on file    Forced sexual activity: Not on file  Other Topics Concern  . Not on file  Social History Narrative  . Not on file   Family History  Problem Relation Age of Onset  . Healthy Mother     OBJECTIVE:  Vitals:   09/11/18 1030  BP: 111/76  Pulse: 88  Resp: 18  Temp: 97.6 F (36.4 C)  TempSrc: Oral  SpO2: 98%     General appearance: alert; appears mildly fatigued, but nontoxic; speaking in full sentences and tolerating own secretions HEENT: NCAT; Ears: EACs clear, TMs pearly gray; Eyes: PERRL.  EOM grossly intact. Nose: nares patent without rhinorrhea, Throat: oropharynx clear, tonsils non erythematous or enlarged, uvula midline  Neck: supple without LAD Lungs: cough: mild; no  respiratory distress; subtle wheezes and rhonchi throughout bilateral lung fields Heart: regular rate and rhythm.  Radial pulses 2+ symmetrical bilaterally Chest wall: NT with AP and lateral compression of chest Skin: warm and dry Psychological: alert and cooperative; normal mood and affect  DIAGNOSTIC STUDIES:  Dg Chest 2 View  Result Date: 09/11/2018 CLINICAL DATA:  Productive cough for 3 days EXAM: CHEST - 2 VIEW COMPARISON:  None. FINDINGS: Interstitial coarsening that is generalized. There is no edema, consolidation, effusion, or pneumothorax. Normal heart size and mediastinal contours. IMPRESSION: Bronchitic markings.  Negative for pneumonia. Electronically Signed   By: Marnee Spring M.D.   On: 09/11/2018 11:32      ASSESSMENT & PLAN:  1. Acute bronchitis, unspecified organism   2. Costochondritis     Meds ordered this encounter  Medications  . DISCONTD: ipratropium-albuterol (DUONEB) 0.5-2.5 (3) MG/3ML nebulizer solution 3 mL  . predniSONE (DELTASONE) 20 MG tablet    Sig: Take 1 tablet (20 mg total) by mouth 2 (two) times daily with a meal for 5 days.    Dispense:  10 tablet    Refill:  0    Order Specific Question:   Supervising Provider    Answer:   Eustace Moore [[id number]]   Chest x-rays did not show sign of pneumonia, but bronchitis Declines breathing treatment and inhaler today Get plenty of rest and push fluids Prednisone prescribed.  Take as directed and to completion Use OTC medications like ibuprofen or tylenol as needed fever or pain Follow up with PCP or [facility] if symptoms persist Return or go to ER if you have any new or worsening symptoms fever, chills, nausea, vomiting, chest pain, cough, shortness of breath, wheezing, abdominal pain, changes in bowel or bladder habits, etc...  Reviewed expectations re: course of current medical issues. Questions answered. Outlined signs and symptoms indicating need for more acute intervention. Patient verbalized understanding. After Visit Summary given.         Rennis Harding, PA-C 09/11/18 1257    Alvino Chapel [place], PA-C 09/11/18 1331

## 2018-09-11 NOTE — Discharge Instructions (Signed)
Chest x-rays did not show sign of pneumonia, but bronchitis Declines breathing treatment and inhaler today Get plenty of rest and push fluids Prednisone prescribed.  Take as directed and to completion Use OTC medications like ibuprofen or tylenol as needed fever or pain Follow up with PCP or [facility] if symptoms persist Return or go to ER if you have any new or worsening symptoms fever, chills, nausea, vomiting, chest pain, cough, shortness of breath, wheezing, abdominal pain, changes in bowel or bladder habits, etc..Marland Kitchen

## 2018-09-11 NOTE — ED Notes (Signed)
Patient able to ambulate independently  

## 2018-09-11 NOTE — ED Triage Notes (Signed)
Pt presents to [facility] for assessment of cough x 3 days, and now left sided chest pain, under the left ribs and up to left shoulder.  Denies SOB, denies diaphoresis, denies light-headedness or dizziness, denies n/v/d.

## 2018-12-29 ENCOUNTER — Ambulatory Visit
Admission: EM | Admit: 2018-12-29 | Discharge: 2018-12-29 | Disposition: A | Discharge status: Home / Self Care | Payer: Managed Care, Other (non HMO) | Attending: Physician Assistant | Admitting: Physician Assistant

## 2018-12-29 ENCOUNTER — Encounter: Payer: Self-pay | Admitting: Emergency Medicine

## 2018-12-29 ENCOUNTER — Other Ambulatory Visit: Payer: Self-pay

## 2018-12-29 DIAGNOSIS — M25511 Pain in right shoulder: Secondary | ICD-10-CM | POA: Diagnosis not present

## 2018-12-29 MED ORDER — METHOCARBAMOL 500 MG PO TABS: 500.0000 mg | ORAL_TABLET | Freq: Two times a day (BID) | ORAL | 0 refills | Status: AC

## 2018-12-29 MED ORDER — MELOXICAM 7.5 MG PO TABS: 7.5000 mg | ORAL_TABLET | Freq: Every day | ORAL | 0 refills | Status: AC

## 2018-12-29 NOTE — ED Provider Notes (Signed)
[facility]    CSN: [id number] Arrival date & time: 12/29/18  1451     History   Chief Complaint Chief Complaint  Patient presents with  . Shoulder Pain    HPI Kirk Harris is a 28 y.o. male.   28 year old male comes in for 4-5 day history of right back/shoulder pain. States thought he pulled a muscle during sleep. However, pain has been traveling from the right back to the shoulder, worse with movement. Pain is constant with cramping/pulling sensation. He has an intermittent cough, nonproductive. Denies rhinorrhea, nasal congestion, sore throat. Denies fever, chills, night sweats. Denies shortness of breath, wheezing. Former smoker.      History reviewed. No pertinent past medical history.  There are no active problems to display for this patient.   History reviewed. No pertinent surgical history.     Home Medications    Prior to Admission medications   Medication Sig Start Date End Date Taking? Authorizing Provider  meloxicam (MOBIC) 7.5 MG tablet Take 1 tablet (7.5 mg total) by mouth daily. 12/29/18   Cathie Hoops, Amy V, PA-C  methocarbamol (ROBAXIN) 500 MG tablet Take 1 tablet (500 mg total) by mouth 2 (two) times daily. 12/29/18   Belinda Fisher, PA-C    Family History Family History  Problem Relation Age of Onset  . Healthy Mother     Social History Social History   Tobacco Use  . Smoking status: Former Games developer  . Smokeless tobacco: Never Used  Substance Use Topics  . Alcohol use: Yes    Comment: occ  . Drug use: No     Allergies   Patient has no known allergies.   Review of Systems Review of Systems  Reason unable to perform ROS: See HPI as above.     Physical Exam Triage Vital Signs ED Triage Vitals [12/29/18 1459]  Enc Vitals Group     BP 118/73     Pulse Rate 64     Resp 18     Temp 98 F (36.7 C)     Temp Source Oral     SpO2 98 %     Weight      Height      Head Circumference      Peak Flow      Pain Score 5     Pain Loc       Pain Edu?      Excl. in GC?    No data found.  Updated Vital Signs BP 118/73 (BP Location: Left Arm)   Pulse 64   Temp 98 F (36.7 C) (Oral)   Resp 18   SpO2 98%   Physical Exam Constitutional:      General: He is not in acute distress.    Appearance: He is well-developed. He is not diaphoretic.  HENT:     Head: Normocephalic and atraumatic.  Eyes:     Conjunctiva/sclera: Conjunctivae normal.     Pupils: Pupils are equal, round, and reactive to light.  Cardiovascular:     Rate and Rhythm: Normal rate and regular rhythm.     Heart sounds: Normal heart sounds. No murmur. No friction rub. No gallop.   Pulmonary:     Effort: Pulmonary effort is normal. No accessory muscle usage or respiratory distress.     Breath sounds: Normal breath sounds. No stridor. No decreased breath sounds, wheezing, rhonchi or rales.  Musculoskeletal:     Comments: No tenderness on palpation of the spinous processes.  Tenderness to palpation of right thoracic back, shoulder. Full range of motion of neck, back, shoulder. Strength normal and equal bilaterally. Sensation intact and equal bilaterally.  Radial pulses 2+ and equal bilaterally. Capillary refill less than 2 seconds.   Skin:    General: Skin is warm and dry.  Neurological:     Mental Status: He is alert and oriented to person, place, and time.      UC Treatments / Results  Labs (all labs ordered are listed, but only abnormal results are displayed) Labs Reviewed - No data to display  EKG None  Radiology No results found.  Procedures Procedures (including critical care time)  Medications Ordered in UC Medications - No data to display  Initial Impression / Assessment and Plan / UC Course  I have reviewed the triage vital signs and the nursing notes.  Pertinent labs & imaging results that were available during my care of the patient were reviewed by me and considered in my medical decision making (see chart for details).     Start NSAID as directed for pain and inflammation. Muscle relaxant as needed. Ice/heat compresses. Discussed with patient strain can take up to 3-4 weeks to resolve, but should be getting better each week. Patient with mild intermittent cough, states not consistent every day. No other URI symptoms, no fever, no shortness of breath. LCTAB. Will have patient monitor for now. Return precautions given. Patient expresses understanding and agrees to plan.  Final Clinical Impressions(s) / UC Diagnoses   Final diagnoses:  Acute pain of right shoulder    ED Prescriptions    Medication Sig Dispense Auth. Provider   meloxicam (MOBIC) 7.5 MG tablet Take 1 tablet (7.5 mg total) by mouth daily. 10 tablet Yu, Amy V, PA-C   methocarbamol (ROBAXIN) 500 MG tablet Take 1 tablet (500 mg total) by mouth 2 (two) times daily. 20 tablet Threasa AlphaYu, Amy V, PA-C        Yu, Amy V, [place] 12/29/18 1524

## 2018-12-29 NOTE — Discharge Instructions (Signed)
Start Mobic. Do not take ibuprofen (motrin/advil)/ naproxen (aleve) while on mobic. Robaxin as needed, this can make you drowsy, so do not take if you are going to drive, operate heavy machinery, or make important decisions. Ice/heat compresses as needed. This can take up to 3-4 weeks to completely resolve, but you should be feeling better each week. Follow up here or with PCP if symptoms worsen, changes for reevaluation. If developing worsening cough, or develop shortness of breath, fever, may need to quarantine

## 2018-12-29 NOTE — ED Notes (Signed)
Patient able to ambulate independently  

## 2018-12-29 NOTE — ED Triage Notes (Signed)
Pt presents to [facility] for assessment of right back pain that is now radiating into his right shoulder.  Pt states he has had an intermittent cough, and he says the pain feels similar (but in a different place) when he got diagnosed with acute bronchitis.

## 2020-05-29 IMAGING — DX DG CHEST 2V
2 series · 2 of 2 positions shown · non-contrast
Comparison: None.

CLINICAL DATA: Productive cough for 3 days

EXAM:
CHEST - 2 VIEW

[chest pa]
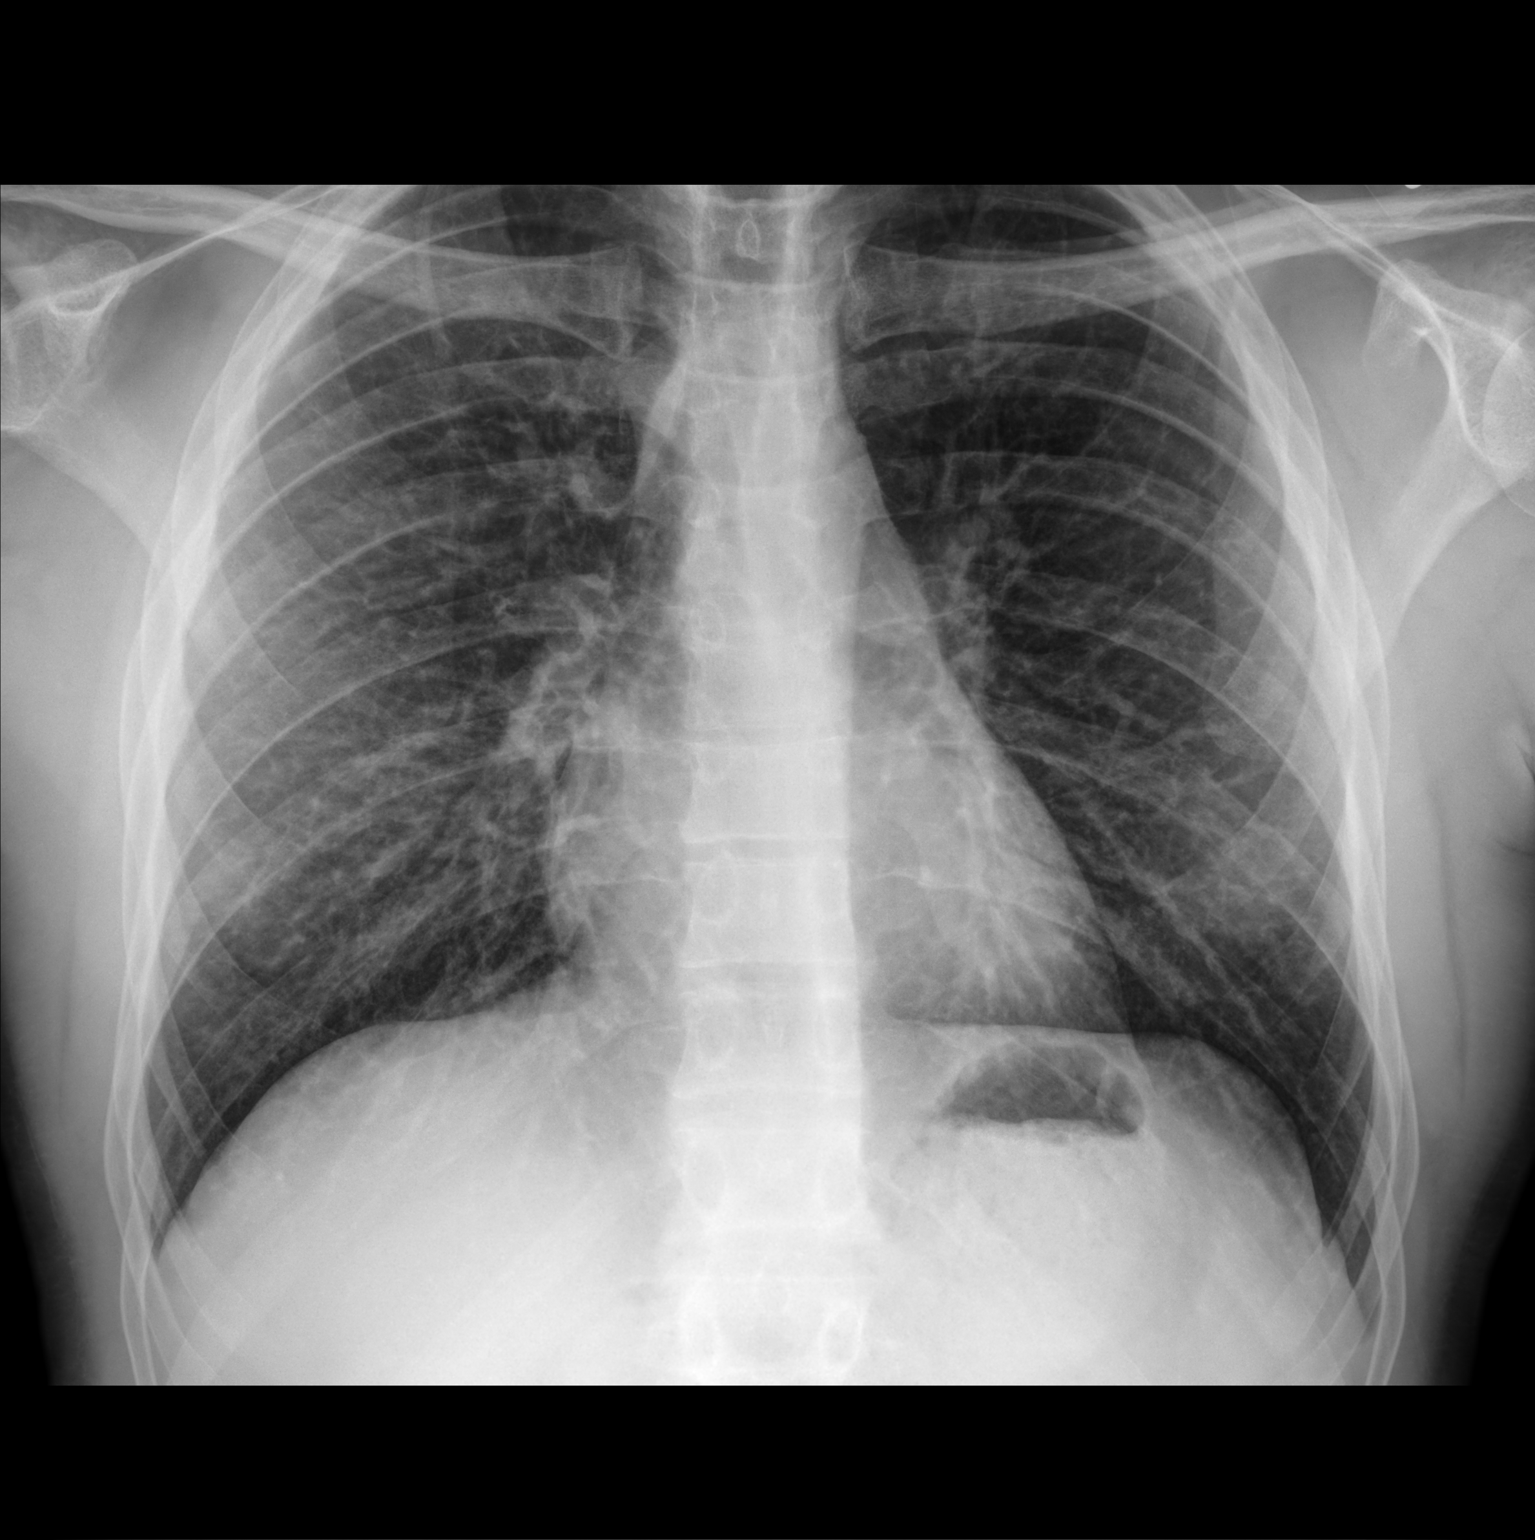

[chest lat]
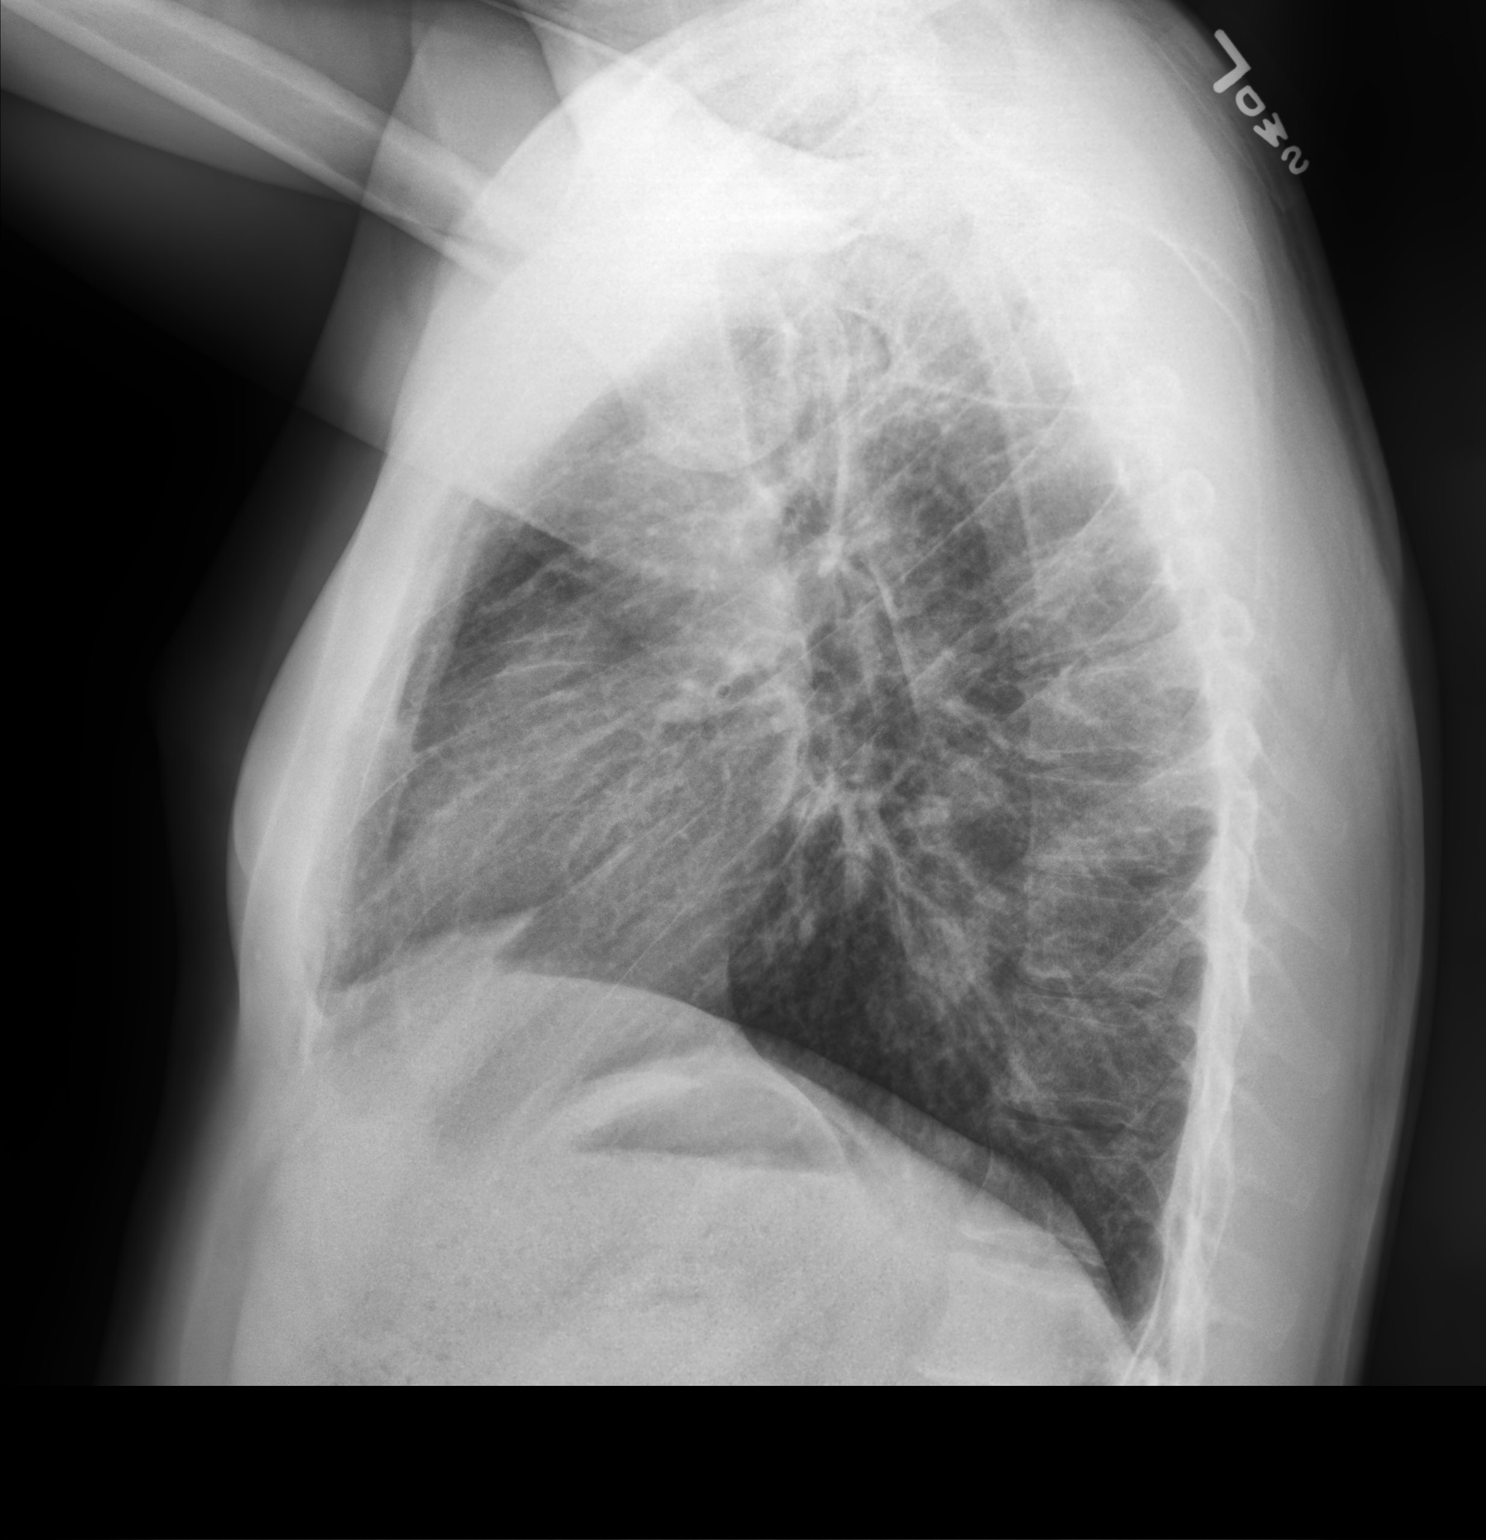

[2 of 2 positions shown; findings below may reference images not displayed]

FINDINGS: Interstitial coarsening that is generalized. There is no edema,
consolidation, effusion, or pneumothorax. Normal heart size and
mediastinal contours.
IMPRESSION: Bronchitic markings.  Negative for pneumonia.

## 2023-04-30 ENCOUNTER — Ambulatory Visit
Admission: EM | Admit: 2023-04-30 | Discharge: 2023-04-30 | Disposition: A | Discharge status: Home / Self Care | Payer: Managed Care, Other (non HMO)

## 2023-04-30 DIAGNOSIS — S0181XA Laceration without foreign body of other part of head, initial encounter: Secondary | ICD-10-CM

## 2023-04-30 NOTE — ED Notes (Signed)
Patient given Saline (Sterile) with Sterile gauze to hold on area (periodically) prior to suture/scab removal by provider (to loose skin/area).
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
## 2023-04-30 NOTE — ED Provider Notes (Signed)
[facility]    CSN: [id number] Arrival date & time: 04/30/23  6578      History   Chief Complaint Chief Complaint  Patient presents with   Suture / Staple Removal    Seen: 04/21/2023 @ [facility]     HPI Kirk Harris is a 32 y.o. male.   Patient here today for evaluation of laceration to chin that occurred 9 days ago.  [place] an [place] hospital and had sutures placed.  He would like to have sutures removed today if possible.  He denies any significant pain in the area.  The history is provided by the patient.  Suture / Staple Removal Pertinent negatives include no shortness of breath.    History reviewed. No pertinent past medical history.  There are no problems to display for this patient.   History reviewed. No pertinent surgical history.     Home Medications    Prior to Admission medications   Medication Sig Start Date End Date Taking? Authorizing Provider  meloxicam (MOBIC) 7.5 MG tablet Take 1 tablet (7.5 mg total) by mouth daily. 12/29/18   Cathie Hoops, Amy V, PA-C  methocarbamol (ROBAXIN) 500 MG tablet Take 1 tablet (500 mg total) by mouth 2 (two) times daily. 12/29/18   Belinda Fisher, PA-C    Family History Family History  Problem Relation Age of Onset   Healthy Mother     Social History Social History   Tobacco Use   Smoking status: Former   Smokeless tobacco: Never  Advertising account planner   Vaping status: Never Used  Substance Use Topics   Alcohol use: Yes    Comment: Occassionally.   Drug use: No     Allergies   Patient has no known allergies.   Review of Systems Review of Systems  Constitutional:  Negative for chills and fever.  Eyes:  Negative for discharge and redness.  Respiratory:  Negative for shortness of breath.   Skin:  Positive for wound. Negative for color change.     Physical Exam Triage Vital Signs ED Triage Vitals  Encounter Vitals Group     BP 04/30/23 0929 112/72     Systolic  BP Percentile --      Diastolic BP Percentile --      Pulse Rate 04/30/23 0929 60     Resp 04/30/23 0929 18     Temp 04/30/23 0929 98.8 F (37.1 C)     Temp Source 04/30/23 0929 Oral     SpO2 04/30/23 0929 96 %     Weight 04/30/23 0929 190 lb (86.2 kg)     Height 04/30/23 0929 6' (1.829 m)     Head Circumference --      Peak Flow --      Pain Score 04/30/23 0924 0     Pain Loc --      Pain Education --      Exclude from Growth Chart --    No data found.  Updated Vital Signs BP 112/72 (BP Location: Left Arm)   Pulse 60   Temp 98.8 F (37.1 C) (Oral)   Resp 18   Ht 6' (1.829 m)   Wt 190 lb (86.2 kg)   SpO2 96%   BMI 25.77 kg/m      Physical Exam Vitals and nursing note reviewed.  Constitutional:      General: He is not in acute distress.    Appearance: Normal appearance. He is not ill-appearing.  HENT:     Head: Normocephalic.     Comments: Approx 4 cm laceration to right chin with crusting over wound-no surrounding erythema, active bleeding or drainage- after soaking and removal of scab 4 sutures noted and removed without complication. Eyes:     Conjunctiva/sclera: Conjunctivae normal.  Neurological:     Mental Status: He is alert.      UC Treatments / Results  Labs (all labs ordered are listed, but only abnormal results are displayed) Labs Reviewed - No data to display  EKG   Radiology No results found.  Procedures Procedures (including critical care time)  Medications Ordered in UC Medications - No data to display  Initial Impression / Assessment and Plan / UC Course  I have reviewed the triage vital signs and the nursing notes.  Pertinent labs & imaging results that were available during my care of the patient were reviewed by me and considered in my medical decision making (see chart for details).    Sutures removed without complication.  Recommended follow-up with any further concerns.  Final Clinical Impressions(s) / UC Diagnoses   Final  diagnoses:  Chin laceration, initial encounter   Discharge Instructions   None    ED Prescriptions   None    PDMP not reviewed this encounter.   Tomi Bamberger, PA-C 04/30/23 1141
